# Patient Record
Sex: Female | Born: 1995 | Race: Black or African American | Hispanic: No | Marital: Single | Smoking: Never smoker
Health system: Southern US, Community
[De-identification: ages and names within clinical notes are randomized; demographics above are authoritative.]

## PROBLEM LIST (undated history)

## (undated) DIAGNOSIS — S060XAA Concussion with loss of consciousness status unknown, initial encounter: Secondary | ICD-10-CM

## (undated) DIAGNOSIS — S060X9A Concussion with loss of consciousness of unspecified duration, initial encounter: Secondary | ICD-10-CM

## (undated) DIAGNOSIS — K219 Gastro-esophageal reflux disease without esophagitis: Secondary | ICD-10-CM

## (undated) HISTORY — PX: NO PAST SURGERIES: SHX2092

---

## 2016-02-26 ENCOUNTER — Encounter (HOSPITAL_COMMUNITY): Payer: Self-pay | Admitting: Emergency Medicine

## 2016-02-26 ENCOUNTER — Emergency Department (HOSPITAL_COMMUNITY): Payer: Managed Care, Other (non HMO)

## 2016-02-26 DIAGNOSIS — K219 Gastro-esophageal reflux disease without esophagitis: Secondary | ICD-10-CM | POA: Insufficient documentation

## 2016-02-26 DIAGNOSIS — R0789 Other chest pain: Secondary | ICD-10-CM | POA: Diagnosis not present

## 2016-02-26 DIAGNOSIS — Z79899 Other long term (current) drug therapy: Secondary | ICD-10-CM | POA: Insufficient documentation

## 2016-02-26 DIAGNOSIS — R112 Nausea with vomiting, unspecified: Secondary | ICD-10-CM | POA: Diagnosis present

## 2016-02-26 LAB — BASIC METABOLIC PANEL
Anion gap: 14 (ref 5–15)
BUN: 7 mg/dL (ref 6–20)
CO2: 20 mmol/L — ABNORMAL LOW (ref 22–32)
Calcium: 9.3 mg/dL (ref 8.9–10.3)
Chloride: 101 mmol/L (ref 101–111)
Creatinine, Ser: 0.74 mg/dL (ref 0.44–1.00)
GFR calc Af Amer: >60 mL/min (ref >60)
GLUCOSE: 100 mg/dL — AB (ref 65–99)
POTASSIUM: 3.8 mmol/L (ref 3.5–5.1)
Sodium: 135 mmol/L (ref 135–145)

## 2016-02-26 LAB — I-STAT TROPONIN, ED: Troponin i, poc: 0 ng/mL (ref 0.00–0.08)

## 2016-02-26 LAB — CBC
HEMATOCRIT: 35 % — AB (ref 36.0–46.0)
Hemoglobin: 11.4 g/dL — ABNORMAL LOW (ref 12.0–15.0)
MCH: 24.7 pg — ABNORMAL LOW (ref 26.0–34.0)
MCHC: 32.6 g/dL (ref 30.0–36.0)
MCV: 75.9 fL — AB (ref 78.0–100.0)
Platelets: 237 10*3/uL (ref 150–400)
RBC: 4.61 MIL/uL (ref 3.87–5.11)
RDW: 15.9 % — AB (ref 11.5–15.5)
WBC: 8.8 10*3/uL (ref 4.0–10.5)

## 2016-02-26 LAB — I-STAT BETA HCG BLOOD, ED (MC, WL, AP ONLY): I-stat hCG, quantitative: <5 m[IU]/mL (ref <5)

## 2016-02-26 NOTE — ED Triage Notes (Signed)
Pt states she is having 9/10 left cp that started today and vomited at least 8 time today, denies any fever or chills, no SOB.

## 2016-02-27 ENCOUNTER — Emergency Department (HOSPITAL_COMMUNITY)
Admission: EM | Admit: 2016-02-27 | Discharge: 2016-02-27 | Disposition: A | Discharge status: Home / Self Care | Payer: Managed Care, Other (non HMO) | Attending: Emergency Medicine | Admitting: Emergency Medicine

## 2016-02-27 DIAGNOSIS — R079 Chest pain, unspecified: Secondary | ICD-10-CM

## 2016-02-27 DIAGNOSIS — R112 Nausea with vomiting, unspecified: Secondary | ICD-10-CM

## 2016-02-27 DIAGNOSIS — K219 Gastro-esophageal reflux disease without esophagitis: Secondary | ICD-10-CM

## 2016-02-27 LAB — HEPATIC FUNCTION PANEL
ALT: 12 U/L — ABNORMAL LOW (ref 14–54)
AST: 24 U/L (ref 15–41)
Albumin: 4 g/dL (ref 3.5–5.0)
Alkaline Phosphatase: 52 U/L (ref 38–126)
Bilirubin, Direct: 0.1 mg/dL (ref 0.1–0.5)
Indirect Bilirubin: 0.9 mg/dL (ref 0.3–0.9)
Total Bilirubin: 1 mg/dL (ref 0.3–1.2)
Total Protein: 7.5 g/dL (ref 6.5–8.1)

## 2016-02-27 LAB — LIPASE, BLOOD: Lipase: 18 U/L (ref 11–51)

## 2016-02-27 MED ORDER — GI COCKTAIL ~~LOC~~
30.0000 mL | Freq: Once | ORAL | Status: AC
  Administered 2016-02-27: 30 mL via ORAL
  Filled 2016-02-27: qty 30

## 2016-02-27 MED ORDER — SODIUM CHLORIDE 0.9 % IV BOLUS (SEPSIS)
1000.0000 mL | Freq: Once | INTRAVENOUS | Status: AC
  Administered 2016-02-27: 1000 mL via INTRAVENOUS

## 2016-02-27 MED ORDER — OMEPRAZOLE 20 MG PO CPDR: 20.0000 mg | DELAYED_RELEASE_CAPSULE | Freq: Every day | ORAL | 0 refills | Status: DC

## 2016-02-27 MED ORDER — ONDANSETRON 4 MG PO TBDP: 4.0000 mg | ORAL_TABLET | Freq: Three times a day (TID) | ORAL | 0 refills | Status: DC | PRN

## 2016-02-27 MED ORDER — ONDANSETRON HCL 4 MG/2ML IJ SOLN
4.0000 mg | Freq: Once | INTRAMUSCULAR | Status: AC
  Administered 2016-02-27: 4 mg via INTRAVENOUS
  Filled 2016-02-27: qty 2

## 2016-02-27 NOTE — ED Notes (Signed)
ED Provider at bedside. 

## 2016-02-27 NOTE — ED Provider Notes (Signed)
[facility]-EMERGENCY DEPT Provider Note   CSN: [id number] Arrival date & time: 02/26/16  2153     History   Chief Complaint Chief Complaint  Patient presents with  . Chest Pain  . Emesis    HPI Deborah Krause is a 21 y.o. female.  Deborah Krause is a 21 y.o. Female who presents to the ED complaining of nausea and vomiting starting yesterday and then onset of left-sided upper chest pain. Patient reports she began with nausea and vomiting around 2 PM yesterday. She reports around 4 PM while she was lying in bed she began having left-sided chest pain. She reports associated burping and symptoms of acid reflux. She denies any abdominal pain. She is currently on her menstrual cycle. She denies any hematemesis. No diarrhea. No urinary symptoms. She denies previous abdominal surgeries. She denies any coughing or shortness of breath. No treatments attempted prior to arrival. Patient reports her nausea and vomiting have resolved while in the waiting room but she still has some left-sided chest pain. She reports this is nonradiating and constant. She reports infrequent intermittent alcohol use. She reports last drinking alcohol 4 weeks ago. Patient denies fevers, coughing, shortness of breath, hemoptysis, diarrhea, urinary symptoms, abdominal pain, leg pain, leg swelling, lightheadedness or syncope.   The history is provided by the patient. No language interpreter was used.  Chest Pain   Associated symptoms include cough, nausea and vomiting. Pertinent negatives include no abdominal pain, no back pain, no dizziness, no fever, no headaches, no numbness, no palpitations, no shortness of breath and no weakness.  Emesis   Associated symptoms include cough. Pertinent negatives include no abdominal pain, no chills, no diarrhea, no fever and no headaches.    History reviewed. No pertinent past medical history.  There are no active problems to display for this patient.   History reviewed. No  pertinent surgical history.  OB History    No data available       Home Medications    Prior to Admission medications   Medication Sig Start Date End Date Taking? Authorizing Provider  Acetaminophen-Caff-Pyrilamine (MIDOL COMPLETE PO) Take 2 tablets by mouth daily as needed (pain).   Yes Historical Provider, MD  omeprazole (PRILOSEC) 20 MG capsule Take 1 capsule (20 mg total) by mouth daily. 02/27/16   Everlene Farrier, PA-C  ondansetron (ZOFRAN ODT) 4 MG disintegrating tablet Take 1 tablet (4 mg total) by mouth every 8 (eight) hours as needed for nausea or vomiting. 02/27/16   Everlene Farrier, PA-C    Family History History reviewed. No pertinent family history.  Social History Social History  Substance Use Topics  . Smoking status: Never Smoker  . Smokeless tobacco: Never Used  . Alcohol use No     Allergies   Patient has no known allergies.   Review of Systems Review of Systems  Constitutional: Negative for chills and fever.  HENT: Negative for congestion and sore throat.   Eyes: Negative for visual disturbance.  Respiratory: Positive for cough. Negative for shortness of breath and wheezing.   Cardiovascular: Positive for chest pain. Negative for palpitations and leg swelling.  Gastrointestinal: Positive for nausea and vomiting. Negative for abdominal pain, blood in stool and diarrhea.  Genitourinary: Negative for decreased urine volume, difficulty urinating, dysuria, flank pain, frequency, hematuria and urgency.  Musculoskeletal: Negative for back pain and neck pain.  Skin: Negative for rash.  Neurological: Negative for dizziness, syncope, weakness, light-headedness, numbness and headaches.     Physical Exam Updated Vital  Signs BP 119/67   Pulse 69   Temp 98.7 F (37.1 C) (Oral)   Resp 17   Ht 5\' 3"  (1.6 m)   Wt 71.5 kg   LMP 02/26/2016   SpO2 100%   BMI 27.92 kg/m   Physical Exam  Constitutional: She appears well-developed and well-nourished. No  distress.  Nontoxic appearing.  HENT:  Head: Normocephalic and atraumatic.  Right Ear: External ear normal.  Left Ear: External ear normal.  Mouth/Throat: Oropharynx is clear and moist.  No blood in her oropharynx.  Eyes: Conjunctivae are normal. Pupils are equal, round, and reactive to light. Right eye exhibits no discharge. Left eye exhibits no discharge.  Neck: Normal range of motion. Neck supple. No JVD present. No tracheal deviation present.  Cardiovascular: Normal rate, regular rhythm, normal heart sounds and intact distal pulses.  Exam reveals no gallop and no friction rub.   No murmur heard. Bilateral radial pulses are intact.  Pulmonary/Chest: Effort normal and breath sounds normal. No stridor. No respiratory distress. She has no wheezes. She has no rales. She exhibits tenderness.  Lungs clear to auscultation bilaterally. Left sided chest wall tenderness to palpation which reproduces her chest pain.  Abdominal: Soft. Bowel sounds are normal. She exhibits no distension and no mass. There is tenderness. There is no rebound and no guarding.  Abdomen is soft and bowel sounds are present. Patient has mild epigastric abdominal tenderness to palpation. No peritoneal signs. No lower abdominal tenderness to palpation. No CVA or flank tenderness.  Musculoskeletal: She exhibits no edema or tenderness.  No lower extremity edema or tenderness.  Lymphadenopathy:    She has no cervical adenopathy.  Neurological: She is alert. Coordination normal.  Skin: Skin is warm and dry. Capillary refill takes less than 2 seconds. No rash noted. She is not diaphoretic. No erythema. No pallor.  Psychiatric: She has a normal mood and affect. Her behavior is normal.  Nursing note and vitals reviewed.    ED Treatments / Results  Labs (all labs ordered are listed, but only abnormal results are displayed) Labs Reviewed  BASIC METABOLIC PANEL - Abnormal; Notable for the following:       Result Value   CO2 20  (*)    Glucose, Bld 100 (*)    All other components within normal limits  CBC - Abnormal; Notable for the following:    Hemoglobin 11.4 (*)    HCT 35.0 (*)    MCV 75.9 (*)    MCH 24.7 (*)    RDW 15.9 (*)    All other components within normal limits  HEPATIC FUNCTION PANEL - Abnormal; Notable for the following:    ALT 12 (*)    All other components within normal limits  LIPASE, BLOOD  I-STAT TROPOININ, ED  I-STAT BETA HCG BLOOD, ED ([facility], [facility], [facility] ONLY)    EKG  EKG Interpretation  Date/Time:  Friday February 26 2016 22:06:41 EST Ventricular Rate:  94 PR Interval:  126 QRS Duration: 72 QT Interval:  364 QTC Calculation: 455 R Axis:   78 Text Interpretation:  Normal sinus rhythm Normal ECG Baseline wander No old tracing to compare Confirmed by KNAPP  MD-I, IVA ([id number]) on 02/27/2016 8:42:58 AM       Radiology Dg Chest 2 View  Result Date: 02/26/2016 CLINICAL DATA:  Mid chest pain today.  Smoker.  History of asthma. EXAM: CHEST  2 VIEW COMPARISON:  None. FINDINGS: The heart size and mediastinal contours are within normal limits. Both  lungs are clear. Lungs are clear. No pleural effusion or pneumothorax seen. The visualized skeletal structures are unremarkable. IMPRESSION: Normal chest x-ray. Electronically Signed   By: Bary RichardStan  Maynard M.D.   On: 02/26/2016 22:44    Procedures Procedures (including critical care time)  Medications Ordered in ED Medications  ondansetron (ZOFRAN) injection 4 mg (4 mg Intravenous Given 02/27/16 0713)  sodium chloride 0.9 % bolus 1,000 mL (0 mLs Intravenous Stopped 02/27/16 0841)  gi cocktail (Maalox,Lidocaine,Donnatal) (30 mLs Oral Given 02/27/16 [id number])     Initial Impression / Assessment and Plan / ED Course  I have reviewed the triage vital signs and the nursing notes.  Pertinent labs & imaging results that were available during my care of the patient were reviewed by me and considered in my medical decision making (see chart for details).      This  is a 21 y.o. Female who presents to the ED complaining of nausea and vomiting starting yesterday and then onset of left-sided upper chest pain. Patient reports she began with nausea and vomiting around 2 PM yesterday. She reports around 4 PM while she was lying in bed she began having left-sided chest pain. She reports associated burping and symptoms of acid reflux. She denies any abdominal pain. She is currently on her menstrual cycle. She denies any hematemesis. No diarrhea. No urinary symptoms. She denies previous abdominal surgeries. She denies any coughing or shortness of breath. No treatments attempted prior to arrival. Patient reports her nausea and vomiting have resolved while in the waiting room but she still has some left-sided chest pain. On exam the patient is afebrile nontoxic appearing. Her abdomen is soft and she has mild epigastric tenderness to palpation. No peritoneal signs. Just some mild reproducible chest pain on palpation. Lungs clear to auscultation bilaterally. EKG shows normal sinus rhythm. Workup started in triage shows normal troponin, negative pregnancy test, unremarkable BMP and CBC. Patient is currently on her menstrual cycle. Hepatic function panel and lipase are unremarkable. Patient received fluid bolus, Zofran and a GI cocktail. She reports a GI cocktail resolved her chest pain. Suspect this is from acid reflux due to her vomiting. No concern for ACS or PE at this time.  We'll discharge her with omeprazole and advised she could use over-the-counter Tums if her acid reflux worsens. We'll also discharge with Zofran. She is tolerating by mouth in the emergency department. I advised the patient to follow-up with their primary care provider this week. I advised the patient to return to the emergency department with new or worsening symptoms or new concerns. The patient verbalized understanding and agreement with plan.       Final Clinical Impressions(s) / ED Diagnoses    Final diagnoses:  Non-intractable vomiting with nausea, unspecified vomiting type  Nonspecific chest pain  Gastroesophageal reflux disease, esophagitis presence not specified    New Prescriptions New Prescriptions   OMEPRAZOLE (PRILOSEC) 20 MG CAPSULE    Take 1 capsule (20 mg total) by mouth daily.   ONDANSETRON (ZOFRAN ODT) 4 MG DISINTEGRATING TABLET    Take 1 tablet (4 mg total) by mouth every 8 (eight) hours as needed for nausea or vomiting.     Everlene FarrierWilliam Zakeria Kulzer, PA-C 02/27/16 [id number]    Devoria AlbeIva Knapp, MD 02/27/16 [phone]

## 2016-03-23 ENCOUNTER — Emergency Department (HOSPITAL_COMMUNITY): Payer: No Typology Code available for payment source

## 2016-03-23 ENCOUNTER — Encounter (HOSPITAL_COMMUNITY): Payer: Self-pay

## 2016-03-23 ENCOUNTER — Emergency Department (HOSPITAL_COMMUNITY)
Admission: EM | Admit: 2016-03-23 | Discharge: 2016-03-23 | Disposition: A | Discharge status: Home / Self Care | Payer: No Typology Code available for payment source | Attending: Emergency Medicine | Admitting: Emergency Medicine

## 2016-03-23 DIAGNOSIS — Y9389 Activity, other specified: Secondary | ICD-10-CM | POA: Diagnosis not present

## 2016-03-23 DIAGNOSIS — Y999 Unspecified external cause status: Secondary | ICD-10-CM | POA: Diagnosis not present

## 2016-03-23 DIAGNOSIS — S2002XA Contusion of left breast, initial encounter: Secondary | ICD-10-CM | POA: Diagnosis not present

## 2016-03-23 DIAGNOSIS — Y92410 Unspecified street and highway as the place of occurrence of the external cause: Secondary | ICD-10-CM | POA: Insufficient documentation

## 2016-03-23 MED ORDER — CYCLOBENZAPRINE HCL 10 MG PO TABS: 10.0000 mg | ORAL_TABLET | Freq: Two times a day (BID) | ORAL | 0 refills | Status: DC | PRN

## 2016-03-23 MED ORDER — ACETAMINOPHEN 500 MG PO TABS
1000.0000 mg | ORAL_TABLET | Freq: Once | ORAL | Status: AC
  Administered 2016-03-23: 1000 mg via ORAL
  Filled 2016-03-23: qty 2

## 2016-03-23 MED ORDER — HYDROCODONE-ACETAMINOPHEN 5-325 MG PO TABS: 1.0000 | ORAL_TABLET | Freq: Four times a day (QID) | ORAL | 0 refills | Status: DC | PRN
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
## 2016-03-23 NOTE — ED Notes (Signed)
Patient transported to X-ray 
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
## 2016-03-27 ENCOUNTER — Encounter (HOSPITAL_COMMUNITY): Payer: Self-pay | Admitting: Emergency Medicine

## 2016-03-27 ENCOUNTER — Emergency Department (HOSPITAL_COMMUNITY): Payer: Managed Care, Other (non HMO)

## 2016-03-27 ENCOUNTER — Emergency Department (HOSPITAL_COMMUNITY)
Admission: EM | Admit: 2016-03-27 | Discharge: 2016-03-28 | Disposition: A | Discharge status: Home / Self Care | Payer: Managed Care, Other (non HMO) | Attending: Emergency Medicine | Admitting: Emergency Medicine

## 2016-03-27 DIAGNOSIS — R079 Chest pain, unspecified: Secondary | ICD-10-CM | POA: Diagnosis present

## 2016-03-27 DIAGNOSIS — N644 Mastodynia: Secondary | ICD-10-CM | POA: Diagnosis not present

## 2016-03-27 DIAGNOSIS — Z79899 Other long term (current) drug therapy: Secondary | ICD-10-CM | POA: Insufficient documentation

## 2016-03-27 DIAGNOSIS — K219 Gastro-esophageal reflux disease without esophagitis: Secondary | ICD-10-CM | POA: Diagnosis not present

## 2016-03-27 HISTORY — DX: Gastro-esophageal reflux disease without esophagitis: K21.9

## 2016-03-27 MED ORDER — ONDANSETRON 4 MG PO TBDP
4.0000 mg | ORAL_TABLET | Freq: Once | ORAL | Status: AC
  Administered 2016-03-27: 4 mg via ORAL
  Filled 2016-03-27: qty 1

## 2016-03-27 MED ORDER — KETOROLAC TROMETHAMINE 30 MG/ML IJ SOLN
30.0000 mg | Freq: Once | INTRAMUSCULAR | Status: AC
  Administered 2016-03-27: 30 mg via INTRAMUSCULAR
  Filled 2016-03-27: qty 1

## 2016-03-27 MED ORDER — GI COCKTAIL ~~LOC~~
30.0000 mL | Freq: Once | ORAL | Status: AC
  Administered 2016-03-27: 30 mL via ORAL
  Filled 2016-03-27: qty 30
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
## 2016-03-28 MED ORDER — OMEPRAZOLE 20 MG PO CPDR: 20.0000 mg | DELAYED_RELEASE_CAPSULE | Freq: Every day | ORAL | 0 refills | Status: DC
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
## 2016-05-04 ENCOUNTER — Encounter (HOSPITAL_COMMUNITY): Payer: Self-pay

## 2016-05-04 ENCOUNTER — Inpatient Hospital Stay (HOSPITAL_COMMUNITY)
Admission: AD | Admit: 2016-05-04 | Discharge: 2016-05-04 | Disposition: A | Discharge status: Home / Self Care | Payer: Managed Care, Other (non HMO) | Source: Ambulatory Visit | Attending: Obstetrics & Gynecology | Admitting: Obstetrics & Gynecology

## 2016-05-04 DIAGNOSIS — N939 Abnormal uterine and vaginal bleeding, unspecified: Secondary | ICD-10-CM | POA: Diagnosis not present

## 2016-05-04 HISTORY — DX: Concussion with loss of consciousness status unknown, initial encounter: S06.0XAA

## 2016-05-04 HISTORY — DX: Concussion with loss of consciousness of unspecified duration, initial encounter: S06.0X9A

## 2016-05-04 LAB — CBC
HCT: 32.5 % — ABNORMAL LOW (ref 36.0–46.0)
Hemoglobin: 10.6 g/dL — ABNORMAL LOW (ref 12.0–15.0)
MCH: 24.9 pg — ABNORMAL LOW (ref 26.0–34.0)
MCHC: 32.6 g/dL (ref 30.0–36.0)
MCV: 76.5 fL — ABNORMAL LOW (ref 78.0–100.0)
PLATELETS: 163 10*3/uL (ref 150–400)
RBC: 4.25 MIL/uL (ref 3.87–5.11)
RDW: 16.5 % — AB (ref 11.5–15.5)
WBC: 5.7 10*3/uL (ref 4.0–10.5)

## 2016-05-04 LAB — WET PREP, GENITAL
SPERM: NONE SEEN
TRICH WET PREP: NONE SEEN
YEAST WET PREP: NONE SEEN

## 2016-05-04 LAB — HCG, SERUM, QUALITATIVE: Preg, Serum: NEGATIVE

## 2016-05-04 NOTE — Discharge Instructions (Signed)
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
## 2016-05-04 NOTE — MAU Note (Signed)
Started period but has really not ever stopped.  The begininng was March 19 th, turned into spotting on 23rd and 24th, turned into heavier flow x 1 day then back to spotting since.  Cramping started on 23rd and worse then regular period.  No birth control.  Feels like low abd is swollen.

## 2016-05-05 LAB — GC/CHLAMYDIA PROBE AMP (~~LOC~~) NOT AT ARMC
CHLAMYDIA, DNA PROBE: NEGATIVE
NEISSERIA GONORRHEA: NEGATIVE

## 2016-05-06 ENCOUNTER — Inpatient Hospital Stay (HOSPITAL_COMMUNITY)
Admission: AD | Admit: 2016-05-06 | Discharge: 2016-05-06 | Disposition: A | Discharge status: Home / Self Care | Payer: Managed Care, Other (non HMO) | Source: Ambulatory Visit | Attending: Obstetrics and Gynecology | Admitting: Obstetrics and Gynecology

## 2016-05-06 DIAGNOSIS — K219 Gastro-esophageal reflux disease without esophagitis: Secondary | ICD-10-CM | POA: Diagnosis not present

## 2016-05-06 DIAGNOSIS — R102 Pelvic and perineal pain: Secondary | ICD-10-CM | POA: Insufficient documentation

## 2016-05-06 DIAGNOSIS — N898 Other specified noninflammatory disorders of vagina: Secondary | ICD-10-CM | POA: Diagnosis not present

## 2016-05-06 DIAGNOSIS — R1111 Vomiting without nausea: Secondary | ICD-10-CM | POA: Insufficient documentation

## 2016-05-06 LAB — RPR: RPR: NONREACTIVE

## 2016-05-06 LAB — URINALYSIS, ROUTINE W REFLEX MICROSCOPIC
BILIRUBIN URINE: NEGATIVE
Glucose, UA: NEGATIVE mg/dL
HGB URINE DIPSTICK: NEGATIVE
KETONES UR: 80 mg/dL — AB
Nitrite: NEGATIVE
Protein, ur: NEGATIVE mg/dL
SPECIFIC GRAVITY, URINE: 1.018 (ref 1.005–1.030)
pH: 7 (ref 5.0–8.0)

## 2016-05-06 LAB — HIV ANTIBODY (ROUTINE TESTING W REFLEX): HIV Screen 4th Generation wRfx: NONREACTIVE

## 2016-05-06 MED ORDER — IBUPROFEN 800 MG PO TABS
800.0000 mg | ORAL_TABLET | Freq: Once | ORAL | Status: AC
  Administered 2016-05-06: 800 mg via ORAL
  Filled 2016-05-06: qty 1
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
## 2016-05-06 NOTE — MAU Note (Signed)
Patient presents with same complaint as 2 days ago, mid lower abdominal pain, all cultures were negative, negative HCG, patient was unable to void, we have sent a UA.

## 2016-05-06 NOTE — Discharge Instructions (Signed)
Acute Pain, Adult °Acute pain is a type of pain that may last for just a few days or as long as six months. It is often related to an illness, injury, or medical procedure. Acute pain may be mild, moderate, or severe. It usually goes away once your injury has healed or you are no longer ill. °Pain can make it hard for you to do daily activities. It can cause anxiety and lead to other problems if left untreated. Treatment depends on the cause and severity of your acute pain. °Follow these instructions at home: °· Check your pain level as told by your health care provider. °· Take over-the-counter and prescription medicines only as told by your health care provider. °· If you are taking prescription pain medicine: °¨ Ask your health care provider about taking a stool softener or laxative to prevent constipation. °¨ Do not stop taking the medicine suddenly. Talk to your health care provider about how and when to discontinue prescription pain medicine. °¨ If your pain is severe, do not take more pills than instructed by your health care provider. °¨ Do not take other over-the-counter pain medicines in addition to this medicine unless told by your health care provider. °¨ Do not drive or operate heavy machinery while taking prescription pain medicine. °· Apply ice or heat as told by your health care provider. These may reduce swelling and pain. °· Ask your health care provider if other strategies such as distraction, relaxation, or physical therapies can help your pain. °· Keep all follow-up visits as told by your health care provider. This is important. °Contact a health care provider if: °· You have pain that is not controlled by medicine. °· Your pain does not improve or gets worse. °· You have side effects from pain medicines, such as vomiting or confusion. °Get help right away if: °· You have severe pain. °· You have trouble breathing. °· You lose consciousness. °· You have chest pain or pressure that lasts for more  than a few minutes. Along with the chest pain you may: °¨ Have pain or discomfort in one or both arms, your back, neck, jaw, or stomach. °¨ Have shortness of breath. °¨ Break out in a cold sweat. °¨ Feel nauseous. °¨ Become light-headed. °These symptoms may represent a serious problem that is an emergency. Do not wait to see if the symptoms will go away. Get medical help right away. Call your local emergency services (911 in the [place] Do not drive yourself to the hospital.  °This information is not intended to replace advice given to you by your health care provider. Make sure you discuss any questions you have with your health care provider. °Document Released: 02/08/2015 Document Revised: 07/03/2015 Document Reviewed: 02/08/2015 °Elsevier Interactive Patient Education © 2017 [employer] ° °

## 2016-05-07 LAB — URINE CULTURE

## 2016-05-08 ENCOUNTER — Emergency Department (HOSPITAL_COMMUNITY): Payer: Managed Care, Other (non HMO)

## 2016-05-08 ENCOUNTER — Encounter (HOSPITAL_COMMUNITY): Payer: Self-pay | Admitting: Emergency Medicine

## 2016-05-08 ENCOUNTER — Emergency Department (HOSPITAL_COMMUNITY)
Admission: EM | Admit: 2016-05-08 | Discharge: 2016-05-08 | Disposition: A | Discharge status: Home / Self Care | Payer: Managed Care, Other (non HMO) | Attending: Emergency Medicine | Admitting: Emergency Medicine

## 2016-05-08 DIAGNOSIS — R102 Pelvic and perineal pain: Secondary | ICD-10-CM

## 2016-05-08 DIAGNOSIS — Z79899 Other long term (current) drug therapy: Secondary | ICD-10-CM | POA: Insufficient documentation

## 2016-05-08 DIAGNOSIS — N938 Other specified abnormal uterine and vaginal bleeding: Secondary | ICD-10-CM

## 2016-05-08 DIAGNOSIS — R1030 Lower abdominal pain, unspecified: Secondary | ICD-10-CM | POA: Diagnosis present

## 2016-05-08 LAB — URINALYSIS, ROUTINE W REFLEX MICROSCOPIC
Bacteria, UA: NONE SEEN
Bilirubin Urine: NEGATIVE
GLUCOSE, UA: NEGATIVE mg/dL
Ketones, ur: 5 mg/dL — AB
Leukocytes, UA: NEGATIVE
Nitrite: NEGATIVE
Protein, ur: NEGATIVE mg/dL
SPECIFIC GRAVITY, URINE: 1.001 — AB (ref 1.005–1.030)
pH: 7 (ref 5.0–8.0)

## 2016-05-08 LAB — CBC
HCT: 35.8 % — ABNORMAL LOW (ref 36.0–46.0)
Hemoglobin: 12.1 g/dL (ref 12.0–15.0)
MCH: 25.3 pg — AB (ref 26.0–34.0)
MCHC: 33.8 g/dL (ref 30.0–36.0)
MCV: 74.9 fL — ABNORMAL LOW (ref 78.0–100.0)
PLATELETS: 192 10*3/uL (ref 150–400)
RBC: 4.78 MIL/uL (ref 3.87–5.11)
RDW: 16.1 % — ABNORMAL HIGH (ref 11.5–15.5)
WBC: 5.9 10*3/uL (ref 4.0–10.5)

## 2016-05-08 LAB — COMPREHENSIVE METABOLIC PANEL
ALBUMIN: 4.8 g/dL (ref 3.5–5.0)
ALK PHOS: 60 U/L (ref 38–126)
ALT: 13 U/L — AB (ref 14–54)
AST: 27 U/L (ref 15–41)
Anion gap: 12 (ref 5–15)
BUN: 7 mg/dL (ref 6–20)
CALCIUM: 9.6 mg/dL (ref 8.9–10.3)
CO2: 22 mmol/L (ref 22–32)
CREATININE: 0.7 mg/dL (ref 0.44–1.00)
Chloride: 102 mmol/L (ref 101–111)
GFR calc Af Amer: >60 mL/min (ref >60)
GFR calc non Af Amer: >60 mL/min (ref >60)
GLUCOSE: 79 mg/dL (ref 65–99)
Potassium: 3.2 mmol/L — ABNORMAL LOW (ref 3.5–5.1)
SODIUM: 136 mmol/L (ref 135–145)
Total Bilirubin: 0.9 mg/dL (ref 0.3–1.2)
Total Protein: 8.8 g/dL — ABNORMAL HIGH (ref 6.5–8.1)

## 2016-05-08 LAB — I-STAT BETA HCG BLOOD, ED (MC, WL, AP ONLY)

## 2016-05-08 LAB — LIPASE, BLOOD: Lipase: 14 U/L (ref 11–51)

## 2016-05-08 MED ORDER — IBUPROFEN 800 MG PO TABS
800.0000 mg | ORAL_TABLET | Freq: Once | ORAL | Status: AC
  Administered 2016-05-08: 800 mg via ORAL
  Filled 2016-05-08: qty 1

## 2016-05-08 MED ORDER — IOPAMIDOL (ISOVUE-300) INJECTION 61%
INTRAVENOUS | Status: AC
  Administered 2016-05-08: 100 mL via INTRAVENOUS
  Filled 2016-05-08: qty 100

## 2016-05-08 MED ORDER — IBUPROFEN 800 MG PO TABS: 800.0000 mg | ORAL_TABLET | Freq: Three times a day (TID) | ORAL | 0 refills | Status: DC

## 2016-05-08 NOTE — ED Triage Notes (Signed)
Patient complaining of abdominal in left and right lower side. Patient states that this has been going on for over a week. Patient states she is spotting.

## 2016-05-08 NOTE — ED Provider Notes (Signed)
[facility]-EMERGENCY DEPT Provider Note   CSN: [id number] Arrival date & time: 05/08/16  1845     History   Chief Complaint Chief Complaint  Patient presents with  . Abdominal Pain    HPI Deborah Krause is a 21 y.o. female.  HPI Patient reports she's had abdominal pain for about a week. She was seen at [facility] last week for lower abdominal cramping and spotting. They did an evaluation at that time and she reports they didn't find anything wrong. She reports it seemed a little better but then the spotting and  lower abdominal pain resumed again. Patient does not use birth control. No fever no pain burning or urgency with urination. No vomiting or diarrhea. Past Medical History:  Diagnosis Date  . Concussion   . GERD (gastroesophageal reflux disease)     There are no active problems to display for this patient.   Past Surgical History:  Procedure Laterality Date  . NO PAST SURGERIES      OB History    Gravida Para Term Preterm AB Living   0 0 0 0 0 0   SAB TAB Ectopic Multiple Live Births   0 0 0 0 0       Home Medications    Prior to Admission medications   Medication Sig Start Date End Date Taking? Authorizing Provider  Acetaminophen-Caff-Pyrilamine (MIDOL COMPLETE PO) Take 2 tablets by mouth daily as needed (pain).   Yes Historical Provider, MD  ibuprofen (ADVIL,MOTRIN) 800 MG tablet Take 1 tablet (800 mg total) by mouth 3 (three) times daily. 05/08/16   Arby Barrette, MD    Family History History reviewed. No pertinent family history.  Social History Social History  Substance Use Topics  . Smoking status: Never Smoker  . Smokeless tobacco: Never Used  . Alcohol use No     Allergies   Patient has no known allergies.   Review of Systems Review of Systems 10 Systems reviewed and are negative for acute change except as noted in the HPI.   Physical Exam Updated Vital Signs BP 129/84 (BP Location: Left Arm)   Pulse 77   Temp 97.9 F (36.6  C) (Oral)   Resp 18   Ht  (1.6 m)   Wt 150 lb (68 kg)   LMP 04/25/2016   SpO2 100%   BMI 26.57 kg/m   Physical Exam  Constitutional: She is oriented to person, place, and time. She appears well-developed and well-nourished. No distress.  HENT:  Head: Normocephalic and atraumatic.  Eyes: Conjunctivae are normal.  Neck: Neck supple.  Cardiovascular: Normal rate and regular rhythm.   No murmur heard. Pulmonary/Chest: Effort normal and breath sounds normal. No respiratory distress.  Abdominal: Soft. There is tenderness.  Diffuse lower abdominal pain without guarding. No localizing and no rebound tenderness.  Musculoskeletal: Normal range of motion. She exhibits no edema or tenderness.  Neurological: She is alert and oriented to person, place, and time. No cranial nerve deficit. She exhibits normal muscle tone. Coordination normal.  Skin: Skin is warm and dry.  Psychiatric: She has a normal mood and affect.  Nursing note and vitals reviewed.    ED Treatments / Results  Labs (all labs ordered are listed, but only abnormal results are displayed) Labs Reviewed  COMPREHENSIVE METABOLIC PANEL - Abnormal; Notable for the following:       Result Value   Potassium 3.2 (*)    Total Protein 8.8 (*)    ALT 13 (*)  All other components within normal limits  CBC - Abnormal; Notable for the following:    HCT 35.8 (*)    MCV 74.9 (*)    MCH 25.3 (*)    RDW 16.1 (*)    All other components within normal limits  URINALYSIS, ROUTINE W REFLEX MICROSCOPIC - Abnormal; Notable for the following:    Color, Urine COLORLESS (*)    Specific Gravity, Urine 1.001 (*)    Hgb urine dipstick LARGE (*)    Ketones, ur 5 (*)    Squamous Epithelial / LPF 0-5 (*)    All other components within normal limits  LIPASE, BLOOD  I-STAT BETA HCG BLOOD, ED ([facility], [facility], [facility] ONLY)    EKG  EKG Interpretation None       Radiology US Transvaginal Non-ob  Result Date: 05/08/2016 CLINICAL DATA:  Initial  evaluation for acute pelvic pain with vaginal spotting. EXAM: TRANSABDOMINAL AND TRANSVAGINAL ULTRASOUND OF PELVIS DOPPLER ULTRASOUND OF OVARIES TECHNIQUE: Both transabdominal and transvaginal ultrasound examinations of the pelvis were performed. Transabdominal technique was performed for global imaging of the pelvis including uterus, ovaries, adnexal regions, and pelvic cul-de-sac. It was necessary to proceed with endovaginal exam following the transabdominal exam to visualize the uterus and ovaries. Color and duplex Doppler ultrasound was utilized to evaluate blood flow to the ovaries. COMPARISON:  None. FINDINGS: Uterus Measurements: 7.1 x 3.8 x 4.5 cm. No fibroids or other mass visualized. Endometrium Thickness: 9.8 mm.  No focal abnormality visualized. Right ovary Measurements: 2.7 x 2.0 x 2.4 cm. Normal appearance/no adnexal mass. Left ovary Measurements: 4.3 x 2.3 x 3.2 cm. Normal appearance/no adnexal mass. 2.5 x 2.0 x 2.0 cm cyst likely reflects a normal physiologic cyst. Pulsed Doppler evaluation of both ovaries demonstrates normal low-resistance arterial and venous waveforms. Other findings Trace free fluid within the pelvis, likely physiologic. IMPRESSION: 1. Normal pelvic ultrasound.  No acute abnormality identified. 2. 2.5 cm physiologic left ovarian cyst. 3. Trace physiologic free fluid within the the pelvis. Electronically Signed   By: Rise Mu M.D.   On: 05/08/2016 21:01   US Pelvis Complete  Result Date: 05/08/2016 CLINICAL DATA:  Initial evaluation for acute pelvic pain with vaginal spotting. EXAM: TRANSABDOMINAL AND TRANSVAGINAL ULTRASOUND OF PELVIS DOPPLER ULTRASOUND OF OVARIES TECHNIQUE: Both transabdominal and transvaginal ultrasound examinations of the pelvis were performed. Transabdominal technique was performed for global imaging of the pelvis including uterus, ovaries, adnexal regions, and pelvic cul-de-sac. It was necessary to proceed with endovaginal exam following the  transabdominal exam to visualize the uterus and ovaries. Color and duplex Doppler ultrasound was utilized to evaluate blood flow to the ovaries. COMPARISON:  None. FINDINGS: Uterus Measurements: 7.1 x 3.8 x 4.5 cm. No fibroids or other mass visualized. Endometrium Thickness: 9.8 mm.  No focal abnormality visualized. Right ovary Measurements: 2.7 x 2.0 x 2.4 cm. Normal appearance/no adnexal mass. Left ovary Measurements: 4.3 x 2.3 x 3.2 cm. Normal appearance/no adnexal mass. 2.5 x 2.0 x 2.0 cm cyst likely reflects a normal physiologic cyst. Pulsed Doppler evaluation of both ovaries demonstrates normal low-resistance arterial and venous waveforms. Other findings Trace free fluid within the pelvis, likely physiologic. IMPRESSION: 1. Normal pelvic ultrasound.  No acute abnormality identified. 2. 2.5 cm physiologic left ovarian cyst. 3. Trace physiologic free fluid within the the pelvis. Electronically Signed   By: Rise Mu M.D.   On: 05/08/2016 21:01   Ct Abdomen Pelvis W Contrast  Result Date: 05/08/2016 CLINICAL DATA:  Abdominal pain. EXAM: CT ABDOMEN  AND PELVIS WITH CONTRAST TECHNIQUE: Multidetector CT imaging of the abdomen and pelvis was performed using the standard protocol following bolus administration of intravenous contrast. CONTRAST:  1 ISOVUE-300 IOPAMIDOL (ISOVUE-300) INJECTION 61% COMPARISON:  None FINDINGS: Lower chest: No acute abnormality. Hepatobiliary: No focal liver abnormality is seen. No gallstones, gallbladder wall thickening, or biliary dilatation. Pancreas: Unremarkable. No pancreatic ductal dilatation or surrounding inflammatory changes. Spleen: Normal in size without focal abnormality. Adrenals/Urinary Tract: Adrenal glands are unremarkable. Kidneys are normal, without renal calculi, focal lesion, or hydronephrosis. Bladder is unremarkable. Stomach/Bowel: Stomach is within normal limits. Appendix appears normal. No evidence of bowel wall thickening, distention, or inflammatory  changes. Vascular/Lymphatic: No significant vascular findings are present. No enlarged abdominal or pelvic lymph nodes. Reproductive: Uterus and bilateral adnexa are unremarkable. Other: There is a small volume of free fluid noted within the pelvis. Musculoskeletal: No acute or significant osseous findings. IMPRESSION: 1. No acute findings within the abdomen or the pelvis. 2. Small volume of free fluid identified within the dependent portion of the pelvis which may be physiologic. Electronically Signed   By: Signa Kell M.D.   On: 05/08/2016 22:35   [place] Art/ven Flow Abd Pelv Doppler  Result Date: 05/08/2016 CLINICAL DATA:  Initial evaluation for acute pelvic pain with vaginal spotting. EXAM: TRANSABDOMINAL AND TRANSVAGINAL ULTRASOUND OF PELVIS DOPPLER ULTRASOUND OF OVARIES TECHNIQUE: Both transabdominal and transvaginal ultrasound examinations of the pelvis were performed. Transabdominal technique was performed for global imaging of the pelvis including uterus, ovaries, adnexal regions, and pelvic cul-de-sac. It was necessary to proceed with endovaginal exam following the transabdominal exam to visualize the uterus and ovaries. Color and duplex Doppler ultrasound was utilized to evaluate blood flow to the ovaries. COMPARISON:  None. FINDINGS: Uterus Measurements: 7.1 x 3.8 x 4.5 cm. No fibroids or other mass visualized. Endometrium Thickness: 9.8 mm.  No focal abnormality visualized. Right ovary Measurements: 2.7 x 2.0 x 2.4 cm. Normal appearance/no adnexal mass. Left ovary Measurements: 4.3 x 2.3 x 3.2 cm. Normal appearance/no adnexal mass. 2.5 x 2.0 x 2.0 cm cyst likely reflects a normal physiologic cyst. Pulsed Doppler evaluation of both ovaries demonstrates normal low-resistance arterial and venous waveforms. Other findings Trace free fluid within the pelvis, likely physiologic. IMPRESSION: 1. Normal pelvic ultrasound.  No acute abnormality identified. 2. 2.5 cm physiologic left ovarian cyst. 3. Trace  physiologic free fluid within the the pelvis. Electronically Signed   By: Rise Mu M.D.   On: 05/08/2016 21:01    Procedures Procedures (including critical care time)  Medications Ordered in ED Medications  ibuprofen (ADVIL,MOTRIN) tablet 800 mg (800 mg Oral Given 05/08/16 2018)  iopamidol (ISOVUE-300) 61 % injection (100 mLs Intravenous Contrast Given 05/08/16 2210)     Initial Impression / Assessment and Plan / ED Course  I have reviewed the triage vital signs and the nursing notes.  Pertinent labs & imaging results that were available during my care of the patient were reviewed by me and considered in my medical decision making (see chart for details).      Final Clinical Impressions(s) / ED Diagnoses   Final diagnoses:  Pelvic pain in female  DUB (dysfunctional uterine bleeding)   Patient was seen at [facility] clinic and evaluated for pelvic pain and dysfunctional uterine bleeding. GC and chlamydia returned negative. Patient reports she continues to have pelvic pain and spotting. Ultrasound was obtained without significant anomaly. Patient still felt very concerned about problems of ongoing pelvic pain and wished to proceed with a CT  scan. The patient was counseled of radiation exposure for low probability of positive CT. She did wish to proceed. No acute anomaly identified. Possibly patient expressing ongoing cramping with spotting due to hormonal fluctuations. She is counseled to follow-up with women's outpatient clinic for ongoing assessment if pain persists. New Prescriptions New Prescriptions   IBUPROFEN (ADVIL,MOTRIN) 800 MG TABLET    Take 1 tablet (800 mg total) by mouth 3 (three) times daily.     Arby Barrette, MD 05/08/16 2303

## 2016-05-08 NOTE — ED Notes (Signed)
US at bedside

## 2016-05-23 ENCOUNTER — Encounter (HOSPITAL_COMMUNITY): Payer: Self-pay | Admitting: Emergency Medicine

## 2016-05-23 DIAGNOSIS — Z79899 Other long term (current) drug therapy: Secondary | ICD-10-CM | POA: Diagnosis not present

## 2016-05-23 DIAGNOSIS — N939 Abnormal uterine and vaginal bleeding, unspecified: Secondary | ICD-10-CM | POA: Insufficient documentation

## 2016-05-23 DIAGNOSIS — Z5321 Procedure and treatment not carried out due to patient leaving prior to being seen by health care provider: Secondary | ICD-10-CM | POA: Insufficient documentation

## 2016-05-23 LAB — POC URINE PREG, ED: Preg Test, Ur: NEGATIVE

## 2016-05-23 NOTE — ED Triage Notes (Signed)
Pt reports having vaginal bleeding with clots for the last 2-3 days. Pt states that she is having pain in abd that is similar to pain from when she was dx with ovarian cyst 2 weeks prior.

## 2016-05-24 ENCOUNTER — Emergency Department (HOSPITAL_COMMUNITY)
Admission: EM | Admit: 2016-05-24 | Discharge: 2016-05-24 | Disposition: A | Discharge status: Home / Self Care | Payer: Managed Care, Other (non HMO) | Attending: Emergency Medicine | Admitting: Emergency Medicine

## 2016-05-24 NOTE — ED Notes (Signed)
No answer when called for room assignment x2

## 2016-06-18 ENCOUNTER — Encounter (HOSPITAL_COMMUNITY): Payer: Self-pay | Admitting: Emergency Medicine

## 2016-06-18 ENCOUNTER — Emergency Department (HOSPITAL_COMMUNITY)
Admission: EM | Admit: 2016-06-18 | Discharge: 2016-06-18 | Disposition: A | Discharge status: Home / Self Care | Payer: Managed Care, Other (non HMO) | Attending: Emergency Medicine | Admitting: Emergency Medicine

## 2016-06-18 ENCOUNTER — Emergency Department (HOSPITAL_COMMUNITY): Payer: Managed Care, Other (non HMO)

## 2016-06-18 DIAGNOSIS — N920 Excessive and frequent menstruation with regular cycle: Secondary | ICD-10-CM | POA: Diagnosis not present

## 2016-06-18 DIAGNOSIS — N939 Abnormal uterine and vaginal bleeding, unspecified: Secondary | ICD-10-CM | POA: Diagnosis present

## 2016-06-18 LAB — COMPREHENSIVE METABOLIC PANEL
ALBUMIN: 4.2 g/dL (ref 3.5–5.0)
ALK PHOS: 53 U/L (ref 38–126)
ALT: 15 U/L (ref 14–54)
ANION GAP: 9 (ref 5–15)
AST: 27 U/L (ref 15–41)
BUN: 6 mg/dL (ref 6–20)
CALCIUM: 8.9 mg/dL (ref 8.9–10.3)
CHLORIDE: 110 mmol/L (ref 101–111)
CO2: 18 mmol/L — AB (ref 22–32)
Creatinine, Ser: 0.66 mg/dL (ref 0.44–1.00)
GFR calc non Af Amer: >60 mL/min (ref >60)
GLUCOSE: 102 mg/dL — AB (ref 65–99)
POTASSIUM: 3 mmol/L — AB (ref 3.5–5.1)
SODIUM: 137 mmol/L (ref 135–145)
Total Bilirubin: 0.7 mg/dL (ref 0.3–1.2)
Total Protein: 7.9 g/dL (ref 6.5–8.1)

## 2016-06-18 LAB — WET PREP, GENITAL
Clue Cells Wet Prep HPF POC: NONE SEEN
Sperm: NONE SEEN
Trich, Wet Prep: NONE SEEN
Yeast Wet Prep HPF POC: NONE SEEN

## 2016-06-18 LAB — CBC
HEMATOCRIT: 33 % — AB (ref 36.0–46.0)
HEMOGLOBIN: 10.8 g/dL — AB (ref 12.0–15.0)
MCH: 25.2 pg — AB (ref 26.0–34.0)
MCHC: 32.7 g/dL (ref 30.0–36.0)
MCV: 76.9 fL — AB (ref 78.0–100.0)
Platelets: 201 10*3/uL (ref 150–400)
RBC: 4.29 MIL/uL (ref 3.87–5.11)
RDW: 15 % (ref 11.5–15.5)
WBC: 5.7 10*3/uL (ref 4.0–10.5)

## 2016-06-18 LAB — URINALYSIS, ROUTINE W REFLEX MICROSCOPIC
BACTERIA UA: NONE SEEN
Bilirubin Urine: NEGATIVE
Glucose, UA: NEGATIVE mg/dL
Ketones, ur: 80 mg/dL — AB
Leukocytes, UA: NEGATIVE
NITRITE: NEGATIVE
PROTEIN: NEGATIVE mg/dL
Specific Gravity, Urine: 1.017 (ref 1.005–1.030)
pH: 7 (ref 5.0–8.0)

## 2016-06-18 LAB — LIPASE, BLOOD: Lipase: 13 U/L (ref 11–51)

## 2016-06-18 LAB — POC URINE PREG, ED: PREG TEST UR: NEGATIVE

## 2016-06-18 MED ORDER — KETOROLAC TROMETHAMINE 30 MG/ML IJ SOLN
30.0000 mg | Freq: Once | INTRAMUSCULAR | Status: AC
  Administered 2016-06-18: 30 mg via INTRAMUSCULAR
  Filled 2016-06-18: qty 1

## 2016-06-18 MED ORDER — ONDANSETRON 4 MG PO TBDP
4.0000 mg | ORAL_TABLET | Freq: Once | ORAL | Status: AC
  Administered 2016-06-18: 4 mg via ORAL
  Filled 2016-06-18: qty 1

## 2016-06-18 MED ORDER — GI COCKTAIL ~~LOC~~
30.0000 mL | Freq: Once | ORAL | Status: AC
  Administered 2016-06-18: 30 mL via ORAL
  Filled 2016-06-18: qty 30

## 2016-06-18 MED ORDER — POTASSIUM CHLORIDE CRYS ER 20 MEQ PO TBCR
40.0000 meq | EXTENDED_RELEASE_TABLET | Freq: Once | ORAL | Status: AC
  Administered 2016-06-18: 40 meq via ORAL
  Filled 2016-06-18: qty 2

## 2016-06-18 MED ORDER — ONDANSETRON HCL 4 MG/2ML IJ SOLN
4.0000 mg | Freq: Once | INTRAMUSCULAR | Status: AC
  Administered 2016-06-18: 4 mg via INTRAVENOUS
  Filled 2016-06-18: qty 2

## 2016-06-18 MED ORDER — IBUPROFEN 600 MG PO TABS: 600.0000 mg | ORAL_TABLET | Freq: Four times a day (QID) | ORAL | 0 refills | Status: AC | PRN

## 2016-06-18 MED ORDER — MORPHINE SULFATE (PF) 4 MG/ML IV SOLN
4.0000 mg | Freq: Once | INTRAVENOUS | Status: AC
  Administered 2016-06-18: 4 mg via INTRAVENOUS
  Filled 2016-06-18: qty 1

## 2016-06-18 MED ORDER — OXYCODONE-ACETAMINOPHEN 5-325 MG PO TABS: 1.0000 | ORAL_TABLET | Freq: Once | ORAL | Status: DC

## 2016-06-18 NOTE — Discharge Instructions (Signed)
Please read and follow all provided instructions.  Your diagnoses today include:  1. Menorrhagia with regular cycle     Tests performed today include: Vital signs. See below for your results today.   Medications prescribed:  Take as prescribed   Home care instructions:  Follow any educational materials contained in this packet.  Follow-up instructions: Please follow-up with Gynecology for further evaluation of symptoms and treatment   Return instructions:  Please return to the Emergency Department if you do not get better, if you get worse, or new symptoms OR  - Fever (temperature greater than 101.81F)  - Bleeding that does not stop with holding pressure to the area    -Severe pain (please note that you may be more sore the day after your accident)  - Chest Pain  - Difficulty breathing  - Severe nausea or vomiting  - Inability to tolerate food and liquids  - Passing out  - Skin becoming red around your wounds  - Change in mental status (confusion or lethargy)  - New numbness or weakness    Please return if you have any other emergent concerns.  Additional Information:  Your vital signs today were: BP 125/85 (BP Location: Left Arm)    Pulse 92    Temp 97.8 F (36.6 C) (Oral)    Resp 18    LMP 06/16/2016    SpO2 100%  If your blood pressure (BP) was elevated above 135/85 this visit, please have this repeated by your doctor within one month. ---------------

## 2016-06-18 NOTE — ED Notes (Signed)
Pt states she is in pain, different pain from when she arrived. Pt describes pain as LUQ with vomiting. Pt was screaming and disruptive, disturbing to other patients. Checked on patient several times and patient continues to yell. Will notify PA of patient's pain.

## 2016-06-18 NOTE — ED Notes (Signed)
Delay on POC urine preg, RN Selena BattenKim was unable to obtain urine sample, per RN Selena BattenKim

## 2016-06-18 NOTE — ED Triage Notes (Signed)
Pt verbalizes menstrual cycle starting 2 days ago with complaint of severe LLQ "burning", severe vaginal bleeding "like water", and emesis. Pt verbalizes hx of left ovarian cyst.

## 2016-06-18 NOTE — ED Provider Notes (Signed)
[facility]-EMERGENCY DEPT Provider Note   CSN: [id number] Arrival date & time: 06/18/16  [id number]     History   Chief Complaint Chief Complaint  Patient presents with  . Abdominal Pain  . Vaginal Bleeding  . Emesis    HPI Deborah Krause is a 21 y.o. female.  HPI  21 y.o. female, presents to the Emergency Department today complaining of severe LLQ "burning sensation" as well as vaginal bleeding and emesis x 2 days ago. Notes hx same last month. Pt states that the abdominal pain feels similar to last month as well as previous to that. Per chart review, [facility] as well as in ED for same. Diagnosed with DUB as well as ovarian cyst. Ultrasound in ED on 05-08-16 showed no evidence of Torsion. CT scan negative. Notes emesis this morning as well as yesterday, which is normal for patient during menstrual cycles. NO CP/SOB. No fevers. Pt is on Day 3 of 5 for her menstrual cycle.  Pt does not take any birth control. Has not attempted medication PTA. No other symptoms noted.   Past Medical History:  Diagnosis Date  . Concussion   . GERD (gastroesophageal reflux disease)     There are no active problems to display for this patient.   Past Surgical History:  Procedure Laterality Date  . NO PAST SURGERIES      OB History    Gravida Para Term Preterm AB Living   0 0 0 0 0 0   SAB TAB Ectopic Multiple Live Births   0 0 0 0 0       Home Medications    Prior to Admission medications   Medication Sig Start Date End Date Taking? Authorizing Provider  Acetaminophen-Caff-Pyrilamine (MIDOL COMPLETE PO) Take 2 tablets by mouth daily as needed (pain).    [provider]  ibuprofen (ADVIL,MOTRIN) 800 MG tablet Take 1 tablet (800 mg total) by mouth 3 (three) times daily. 05/08/16   Arby BarrettePfeiffer, Marcy, MD    Family History No family history on file.  Social History Social History  Substance Use Topics  . Smoking status: Never Smoker  . Smokeless tobacco: Never Used  .  Alcohol use No     Allergies   Patient has no known allergies.   Review of Systems Review of Systems ROS reviewed and all are negative for acute change except as noted in the HPI.  Physical Exam Updated Vital Signs BP 125/85 (BP Location: Left Arm)   Pulse 92   Temp 97.8 F (36.6 C) (Oral)   Resp 18   LMP 06/16/2016   SpO2 100%   Physical Exam  Constitutional: She is oriented to person, place, and time. Vital signs are normal. She appears well-developed and well-nourished.  HENT:  Head: Normocephalic and atraumatic.  Right Ear: Hearing normal.  Left Ear: Hearing normal.  Eyes: Conjunctivae and EOM are normal. Pupils are equal, round, and reactive to light.  Neck: Normal range of motion. Neck supple.  Cardiovascular: Normal rate, regular rhythm, normal heart sounds and intact distal pulses.   Pulmonary/Chest: Effort normal and breath sounds normal.  Abdominal: Soft. Normal appearance and bowel sounds are normal. There is no tenderness. There is no rigidity, no rebound, no guarding, no CVA tenderness, no tenderness at McBurney's point and negative Murphy's sign.  Musculoskeletal: Normal range of motion.  Neurological: She is alert and oriented to person, place, and time.  Skin: Skin is warm and dry.  Psychiatric: She has a  normal mood and affect. Her speech is normal and behavior is normal. Thought content normal.  Nursing note and vitals reviewed.  Exam performed by Eston Esters,  exam chaperoned Date: 06/18/2016 Pelvic exam: normal external genitalia without evidence of trauma. VULVA: normal appearing vulva with no masses, tenderness or lesion. VAGINA: normal appearing vagina with normal color and discharge, no lesions. CERVIX: normal appearing cervix without lesions, cervical motion tenderness absent, cervical os closed with out purulent discharge; vaginal discharge - bloody, Wet prep and DNA probe for chlamydia and GC obtained.   ADNEXA: normal adnexa in size, nontender  and no masses UTERUS: uterus is normal size, shape, consistency and nontender.    ED Treatments / Results  Labs (all labs ordered are listed, but only abnormal results are displayed) Labs Reviewed  WET PREP, GENITAL - Abnormal; Notable for the following:       Result Value   WBC, Wet Prep HPF POC RARE (*)    All other components within normal limits  URINALYSIS, ROUTINE W REFLEX MICROSCOPIC - Abnormal; Notable for the following:    APPearance HAZY (*)    Hgb urine dipstick MODERATE (*)    Ketones, ur 80 (*)    Squamous Epithelial / LPF 0-5 (*)    All other components within normal limits  COMPREHENSIVE METABOLIC PANEL - Abnormal; Notable for the following:    Potassium 3.0 (*)    CO2 18 (*)    Glucose, Bld 102 (*)    All other components within normal limits  CBC - Abnormal; Notable for the following:    Hemoglobin 10.8 (*)    HCT 33.0 (*)    MCV 76.9 (*)    MCH 25.2 (*)    All other components within normal limits  LIPASE, BLOOD  POC URINE PREG, ED  GC/CHLAMYDIA PROBE AMP ([place]) NOT AT [facility]    EKG  EKG Interpretation None       Radiology US Transvaginal Non-ob  Result Date: 06/18/2016 CLINICAL DATA:  Left lower quadrant and pelvic pain. EXAM: TRANSABDOMINAL AND TRANSVAGINAL ULTRASOUND OF PELVIS DOPPLER ULTRASOUND OF OVARIES TECHNIQUE: Both transabdominal and transvaginal ultrasound examinations of the pelvis were performed. Transabdominal technique was performed for global imaging of the pelvis including uterus, ovaries, adnexal regions, and pelvic cul-de-sac. It was necessary to proceed with endovaginal exam following the transabdominal exam to visualize the uterus and ovaries. Color and duplex Doppler ultrasound was utilized to evaluate blood flow to the ovaries. COMPARISON:  05/08/2016 FINDINGS: Uterus Measurements: 6.8 x 3.6 x 4.1 cm. No fibroids or other mass visualized. Endometrium Thickness: 6 mm.  No focal abnormality visualized. Right ovary Measurements:  1.8 x 2.7 x 2.2 cm. Normal appearance/no adnexal mass. Left ovary Measurements: 2.8 x 1.8 x 2.3 cm. Normal appearance/no adnexal mass. Pulsed Doppler evaluation of both ovaries demonstrates normal low-resistance arterial and venous waveforms. Other findings No abnormal free fluid. IMPRESSION: Normal pelvic ultrasound.  No evidence of ovarian torsion. Electronically Signed   By: Irish Lack M.D.   On: 06/18/2016 15:14   US Pelvis Complete  Result Date: 06/18/2016 CLINICAL DATA:  Left lower quadrant and pelvic pain. EXAM: TRANSABDOMINAL AND TRANSVAGINAL ULTRASOUND OF PELVIS DOPPLER ULTRASOUND OF OVARIES TECHNIQUE: Both transabdominal and transvaginal ultrasound examinations of the pelvis were performed. Transabdominal technique was performed for global imaging of the pelvis including uterus, ovaries, adnexal regions, and pelvic cul-de-sac. It was necessary to proceed with endovaginal exam following the transabdominal exam to visualize the uterus and ovaries. Color and  duplex Doppler ultrasound was utilized to evaluate blood flow to the ovaries. COMPARISON:  05/08/2016 FINDINGS: Uterus Measurements: 6.8 x 3.6 x 4.1 cm. No fibroids or other mass visualized. Endometrium Thickness: 6 mm.  No focal abnormality visualized. Right ovary Measurements: 1.8 x 2.7 x 2.2 cm. Normal appearance/no adnexal mass. Left ovary Measurements: 2.8 x 1.8 x 2.3 cm. Normal appearance/no adnexal mass. Pulsed Doppler evaluation of both ovaries demonstrates normal low-resistance arterial and venous waveforms. Other findings No abnormal free fluid. IMPRESSION: Normal pelvic ultrasound.  No evidence of ovarian torsion. Electronically Signed   By: Irish Lack M.D.   On: 06/18/2016 15:14   [place] Art/ven Flow Abd Pelv Doppler  Result Date: 06/18/2016 CLINICAL DATA:  Left lower quadrant and pelvic pain. EXAM: TRANSABDOMINAL AND TRANSVAGINAL ULTRASOUND OF PELVIS DOPPLER ULTRASOUND OF OVARIES TECHNIQUE: Both transabdominal and transvaginal  ultrasound examinations of the pelvis were performed. Transabdominal technique was performed for global imaging of the pelvis including uterus, ovaries, adnexal regions, and pelvic cul-de-sac. It was necessary to proceed with endovaginal exam following the transabdominal exam to visualize the uterus and ovaries. Color and duplex Doppler ultrasound was utilized to evaluate blood flow to the ovaries. COMPARISON:  05/08/2016 FINDINGS: Uterus Measurements: 6.8 x 3.6 x 4.1 cm. No fibroids or other mass visualized. Endometrium Thickness: 6 mm.  No focal abnormality visualized. Right ovary Measurements: 1.8 x 2.7 x 2.2 cm. Normal appearance/no adnexal mass. Left ovary Measurements: 2.8 x 1.8 x 2.3 cm. Normal appearance/no adnexal mass. Pulsed Doppler evaluation of both ovaries demonstrates normal low-resistance arterial and venous waveforms. Other findings No abnormal free fluid. IMPRESSION: Normal pelvic ultrasound.  No evidence of ovarian torsion. Electronically Signed   By: Irish Lack M.D.   On: 06/18/2016 15:14    Procedures Procedures (including critical care time)  Medications Ordered in ED Medications  ketorolac (TORADOL) 30 MG/ML injection 30 mg (30 mg Intramuscular Given 06/18/16 1049)  ondansetron (ZOFRAN-ODT) disintegrating tablet 4 mg (4 mg Oral Given 06/18/16 1048)  gi cocktail (Maalox,Lidocaine,Donnatal) (30 mLs Oral Given 06/18/16 1129)  morphine 4 MG/ML injection 4 mg (4 mg Intravenous Given 06/18/16 1245)  ondansetron (ZOFRAN) injection 4 mg (4 mg Intravenous Given 06/18/16 1245)  potassium chloride SA (K-DUR,KLOR-CON) CR tablet 40 mEq (40 mEq Oral Given 06/18/16 1426)     Initial Impression / Assessment and Plan / ED Course  I have reviewed the triage vital signs and the nursing notes.  Pertinent labs & imaging results that were available during my care of the patient were reviewed by me and considered in my medical decision making (see chart for details).  Final Clinical  Impressions(s) / ED Diagnoses  {I have reviewed and evaluated the relevant laboratory values. {I have reviewed and evaluated the relevant imaging studies.  {I have reviewed the relevant previous healthcare records.  {I obtained HPI from historian.   ED Course:  Assessment: Pt is a 21 y.o. female who presents with LLQ "burning sensation" as well as vaginal bleeding and emesis x 2 days ago. Notes hx same last month. Pt states that the abdominal pain feels similar to last month as well as previous to that. Per chart review, pt seen at [facility] as well as in ED for same. Diagnosed with DUB as well as ovarian cyst. Ultrasound in ED on 05-08-16 showed no evidence of Torsion. CT scan negative. Notes emesis this morning as well as yesterday, which is normal for patient during menstrual cycles. . On exam, pt in NAD. Nontoxic/nonseptic appearing. VSS.  Afebrile. Lungs CTA. Heart RRR. Abdomen nontender soft. GU Exam unremarkable. Bleeding noted, but expected as patient on Day 3 of 5 for menstrual cycle. No Adnexal. No CMT. Wet prep unremarkable. GC obtained. UA negative. Given Toradol in ED. Likely menorrhagia from suspect ovarian cyst. Plan is to DC home with follow up to OBGYN. At time of discharge, Patient is in no acute distress. Vital Signs are stable. Patient is able to ambulate. Patient able to tolerate PO.   11:07 AM- Pt began screaming in ED due to pain. When I entered room, endorsed pain in LUQ/Epigastric region. Pt believed to be due to emesis. Denies hematochezia. Does not endorse worsening pain in lower quadrants. On exam the abdomen was soft, but TTP LUQ/Epigastric region. CBC/CMP/Lipase ordered. GI cocktail given as well as analgesia. Due to persistent symptoms of nausea and emesis, will order Transvaginal US as well. Pt states it occurred similarly last year with emesis in this frequency, but states that it has been a while. No hx Torsion.   3:18 PM- Ultrasound unremarkable. Pain controlled. Will  DC patient home with follow up to GYN. At time of discharge, Patient is in no acute distress. Vital Signs are stable. Patient is able to ambulate. Patient able to tolerate PO.   Disposition/Plan:  DC Home Additional Verbal discharge instructions given and discussed with patient.  Pt Instructed to f/u with GYN in the next week for evaluation and treatment of symptoms. Return precautions given Pt acknowledges and agrees with plan  Supervising Physician Jerelyn Scott, MD  Final diagnoses:  Menorrhagia with regular cycle    New Prescriptions New Prescriptions   No medications on file     Audry Pili, Cordelia Poche 06/18/16 1518    Jerelyn Scott, MD 06/18/16 1523

## 2016-06-18 NOTE — ED Notes (Signed)
Pt vomiting, PA aware. Pain meds held.

## 2016-06-18 NOTE — ED Notes (Signed)
URINE REQUEST HAS BEEN MADE 

## 2016-06-18 NOTE — ED Notes (Signed)
Unsuccessful IV attempt x2. Will have another RN attempt [place] IV.

## 2016-06-20 LAB — GC/CHLAMYDIA PROBE AMP (~~LOC~~) NOT AT ARMC
CHLAMYDIA, DNA PROBE: NEGATIVE
Neisseria Gonorrhea: NEGATIVE

## 2018-10-01 IMAGING — US US PELVIS COMPLETE
1 series · 13 of 25 positions shown · non-contrast
Comparison: None.

CLINICAL DATA: Initial evaluation for acute pelvic pain with
vaginal spotting.

EXAM:
TRANSABDOMINAL AND TRANSVAGINAL ULTRASOUND OF PELVIS
DOPPLER ULTRASOUND OF OVARIES
TECHNIQUE: Both transabdominal and transvaginal ultrasound examinations of the
pelvis were performed. Transabdominal technique was performed for
global imaging of the pelvis including uterus, ovaries, adnexal
regions, and pelvic cul-de-sac.
It was necessary to proceed with endovaginal exam following the
transabdominal exam to visualize the uterus and ovaries. Color and
duplex Doppler ultrasound was utilized to evaluate blood flow to the
ovaries.

[Series 1: us pelvis complete · 0.20mm/px · 13 of 87 slices shown]
[im 1/87]
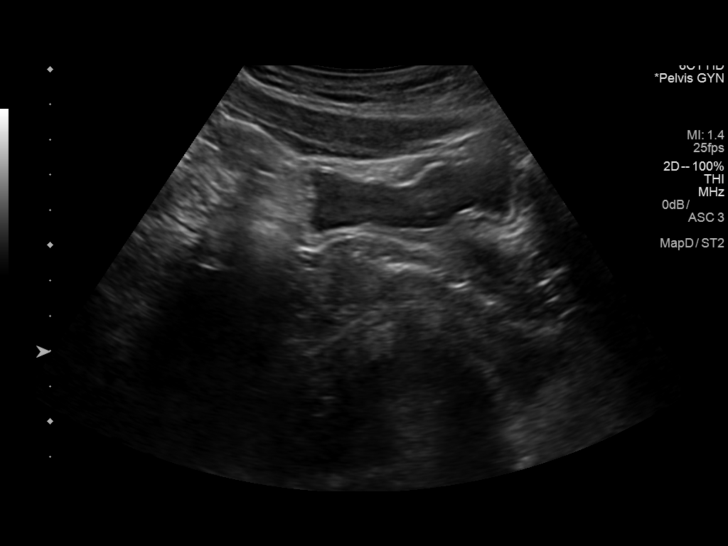
[im 8/87]
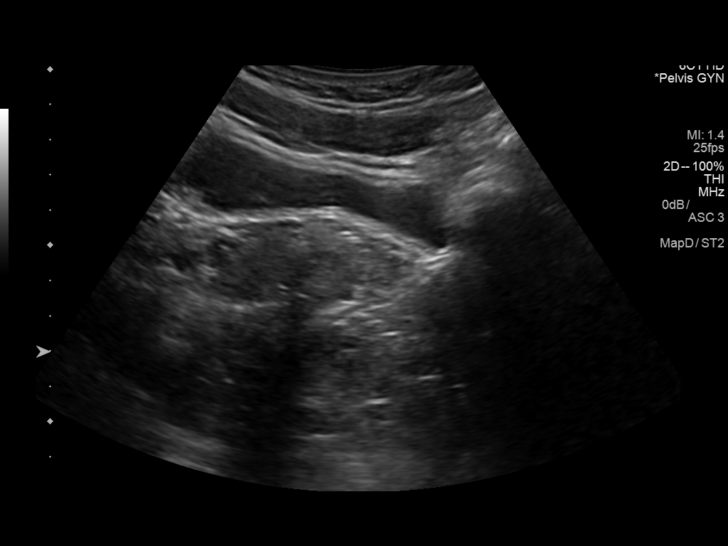
[im 15/87]
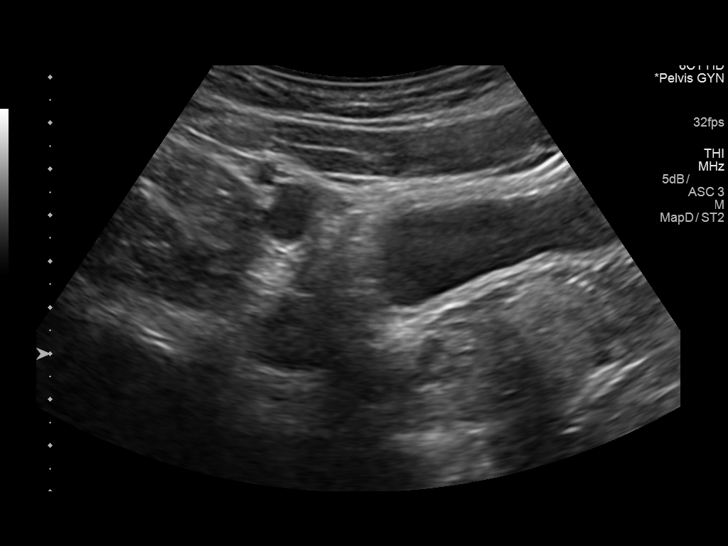
[im 22/87]
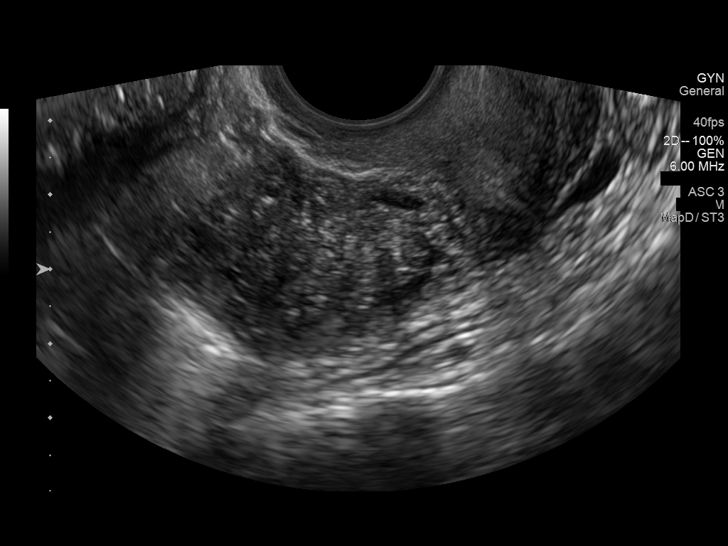
[im 29/87]
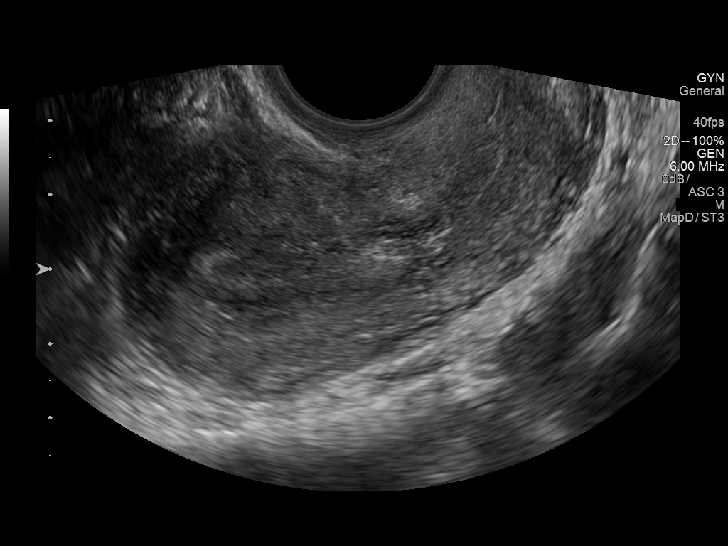
[im 36/87]
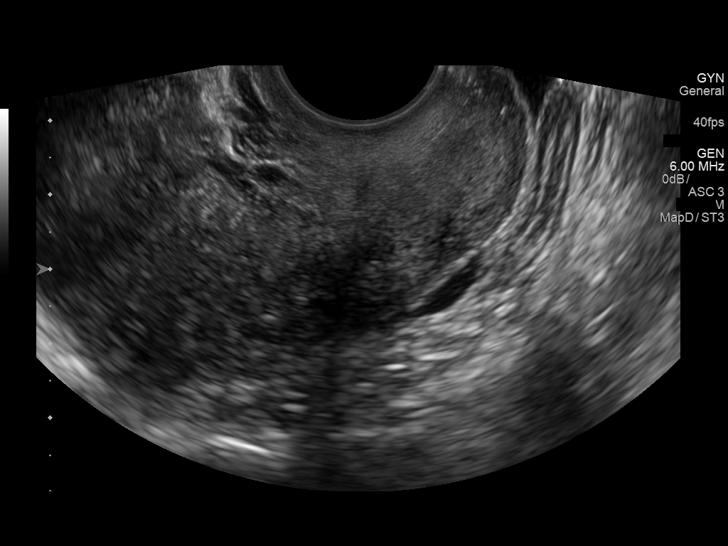
[im 44/87]
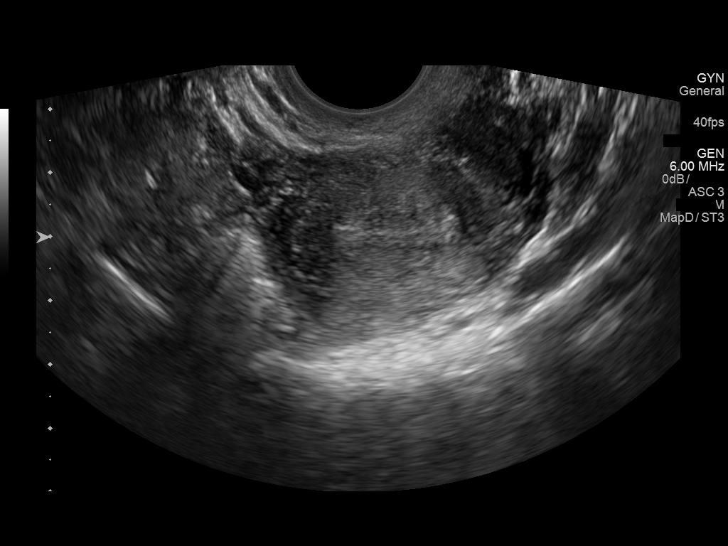
[im 51/87]
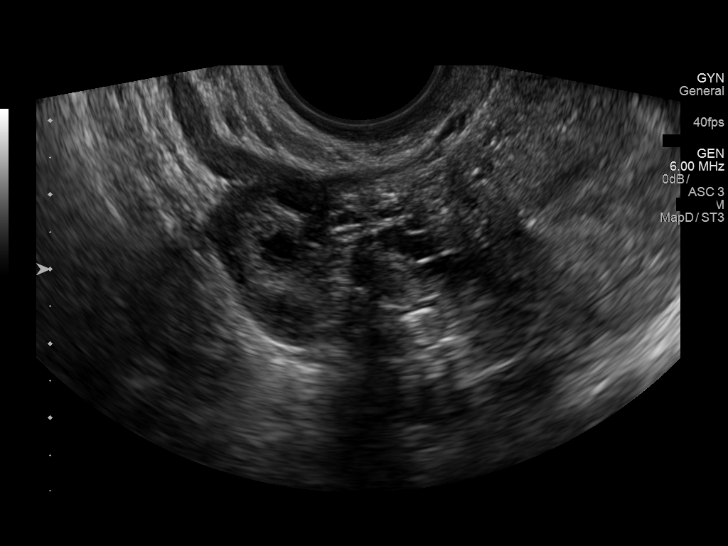
[im 58/87]
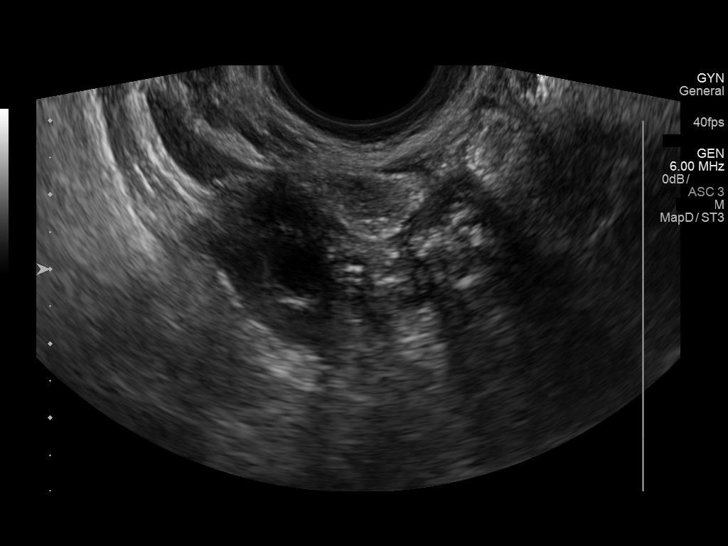
[im 65/87]
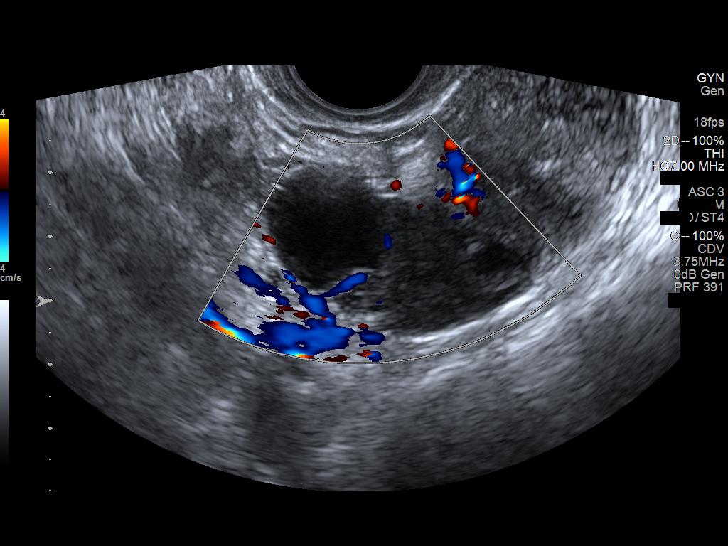
[im 72/87]
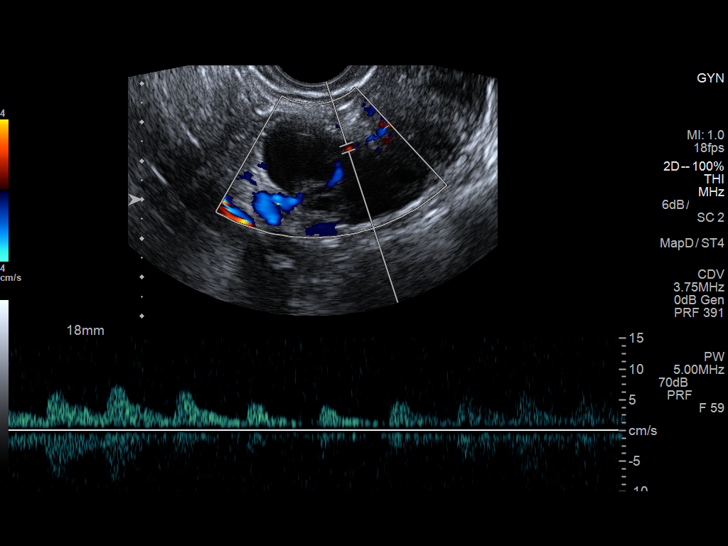
[im 79/87]
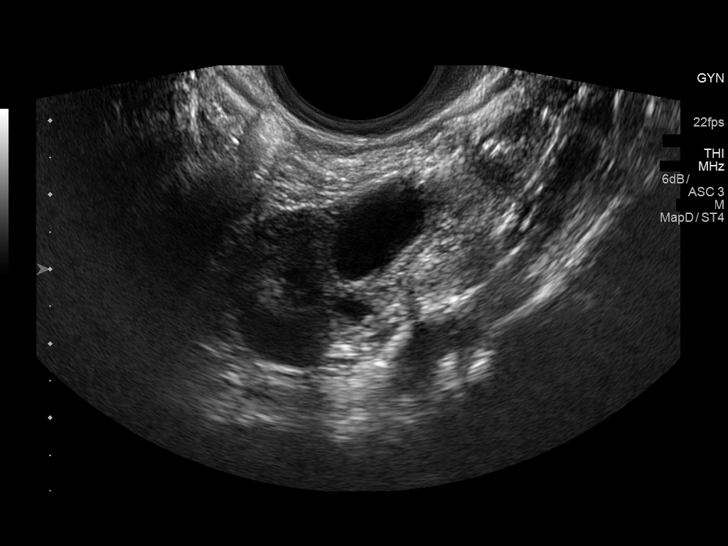
[im 87/87]
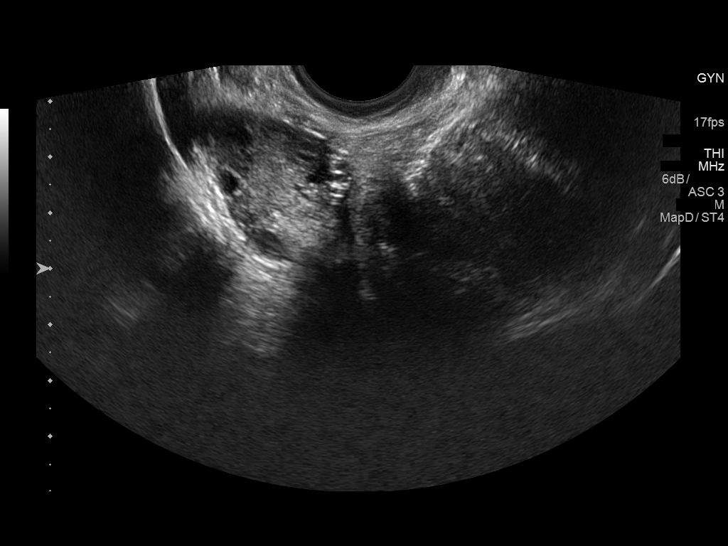

[13 of 25 positions shown; findings below may reference images not displayed]

FINDINGS: Uterus

Measurements: 7.1 x 3.8 x 4.5 cm. No fibroids or other mass
visualized.

Endometrium

Thickness: 9.8 mm.  No focal abnormality visualized.

Right ovary

Measurements: 2.7 x 2.0 x 2.4 cm. Normal appearance/no adnexal mass.

Left ovary

Measurements: 4.3 x 2.3 x 3.2 cm.. Normal appearance/no adnexal
mass. 2.5 x 2.0 x 2.0 cm cyst likely reflects a normal physiologic
cyst.

Pulsed Doppler evaluation of both ovaries demonstrates normal
low-resistance arterial and venous waveforms.

Other findings

Trace free fluid within the pelvis, likely physiologic.
IMPRESSION: 1. Normal pelvic ultrasound.  No acute abnormality identified.
2. 2.5 cm physiologic left ovarian cyst.
3. Trace physiologic free fluid within the the pelvis.

## 2018-11-11 IMAGING — US US TRANSVAGINAL NON-OB
2 series · 13 of 25 positions shown · non-contrast
Comparison: 05/08/2016

CLINICAL DATA: Left lower quadrant and pelvic pain.

EXAM:
TRANSABDOMINAL AND TRANSVAGINAL ULTRASOUND OF PELVIS
DOPPLER ULTRASOUND OF OVARIES
TECHNIQUE: Both transabdominal and transvaginal ultrasound examinations of the
pelvis were performed. Transabdominal technique was performed for
global imaging of the pelvis including uterus, ovaries, adnexal
regions, and pelvic cul-de-sac.
It was necessary to proceed with endovaginal exam following the
transabdominal exam to visualize the uterus and ovaries. Color and
duplex Doppler ultrasound was utilized to evaluate blood flow to the
ovaries.

[Series 1: us transvaginal non-ob · 0.24mm/px · 12 of 57 slices shown (1 of 2)]
[im 1/57]
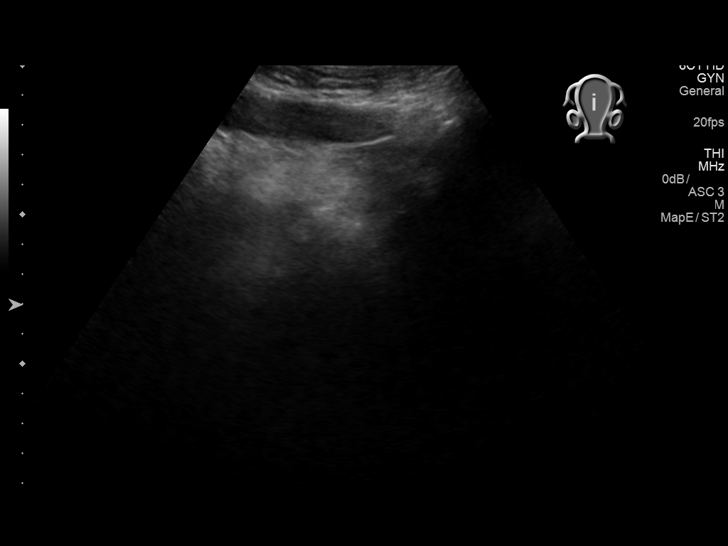
[im 5/57]
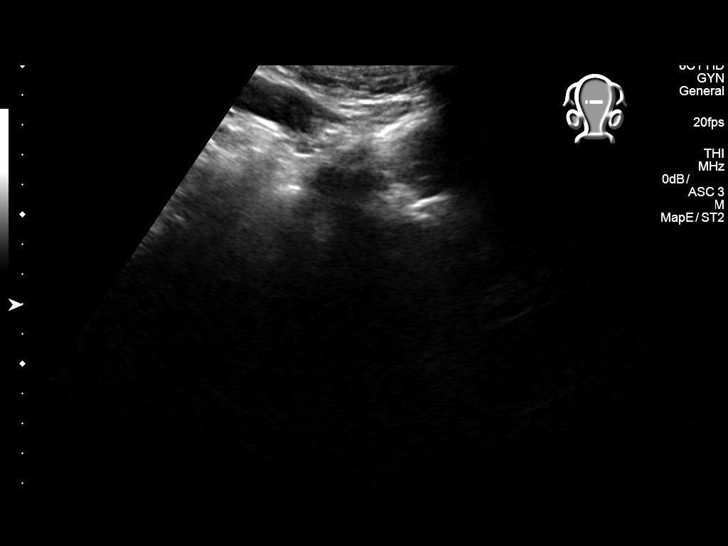
[im 10/57]
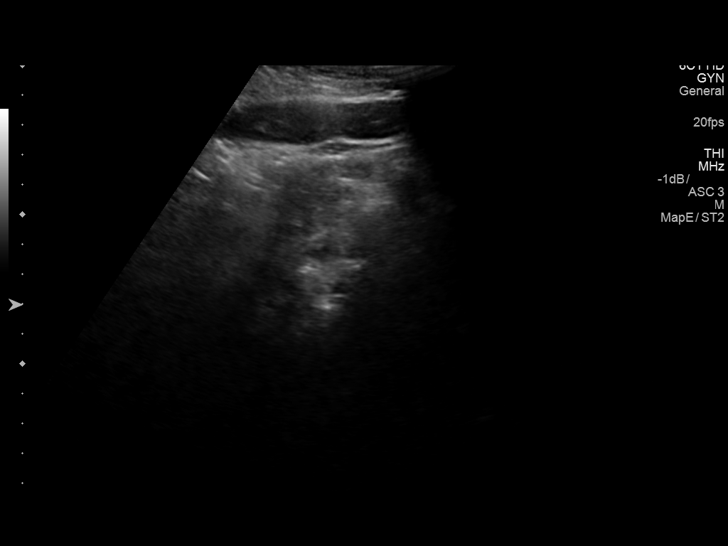
[im 15/57]
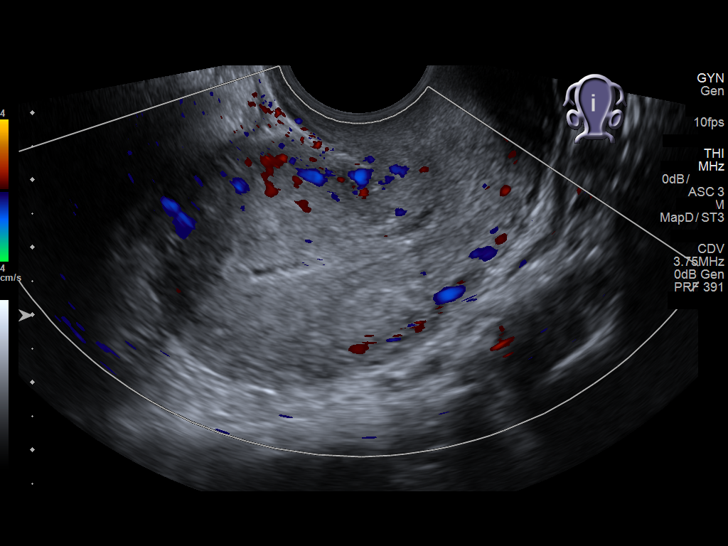
[im 20/57]
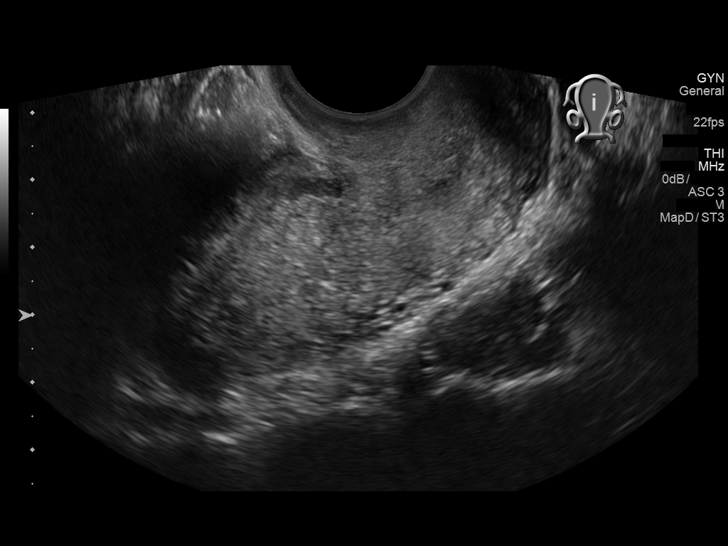
[im 25/57]
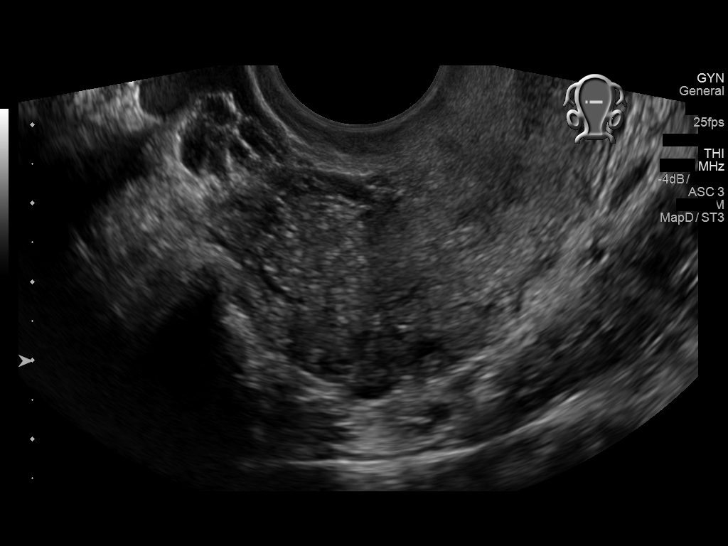
[im 30/57]
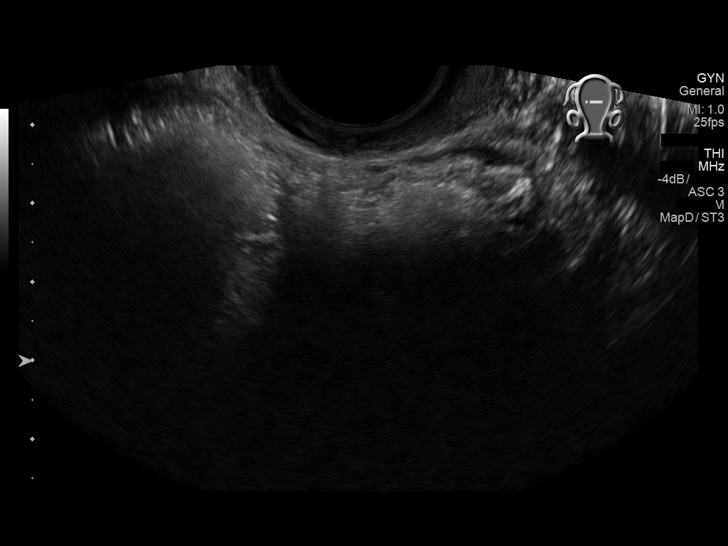
[im 35/57]
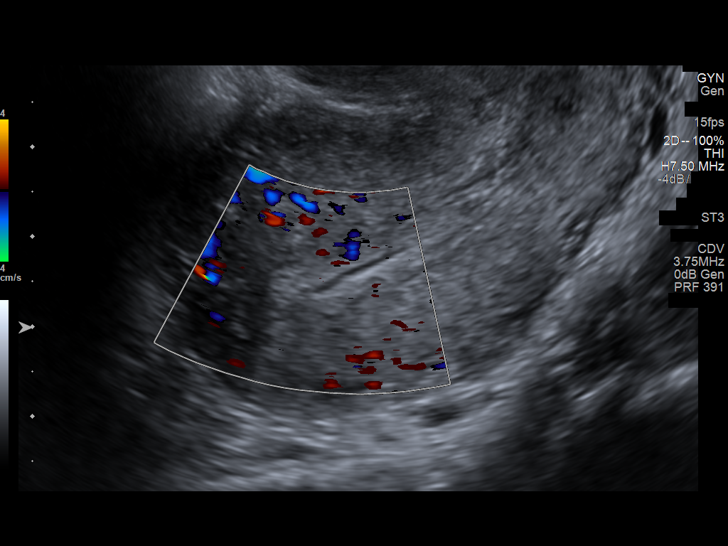
[im 39/57]
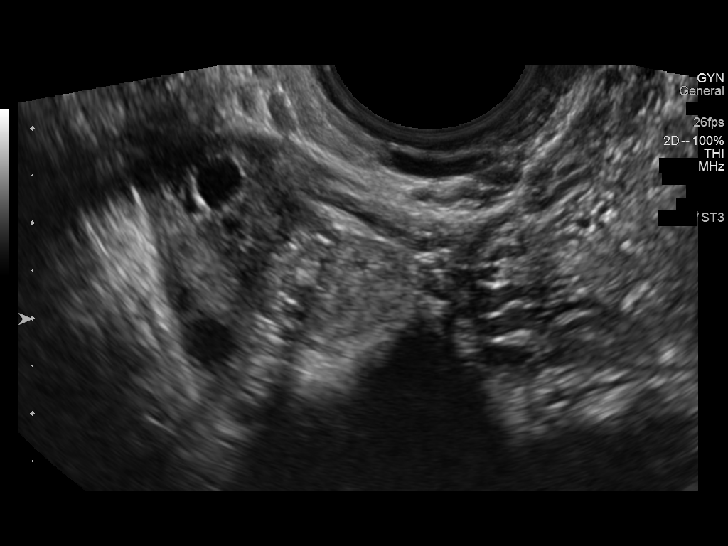
[im 44/57]
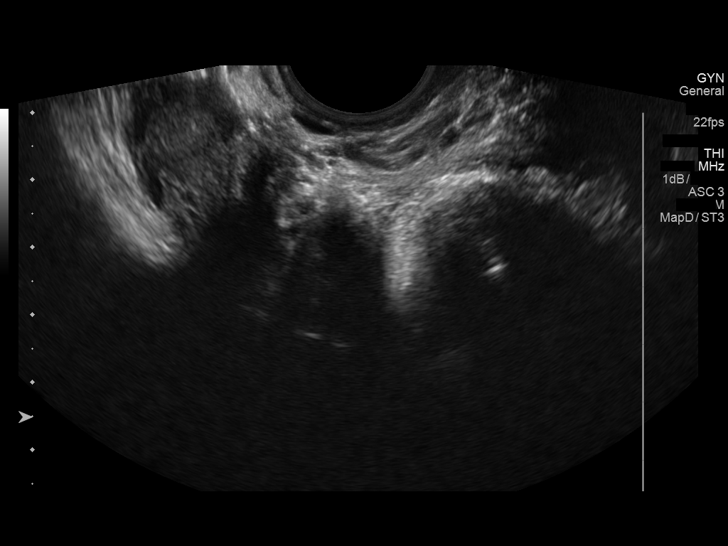
[im 49/57]
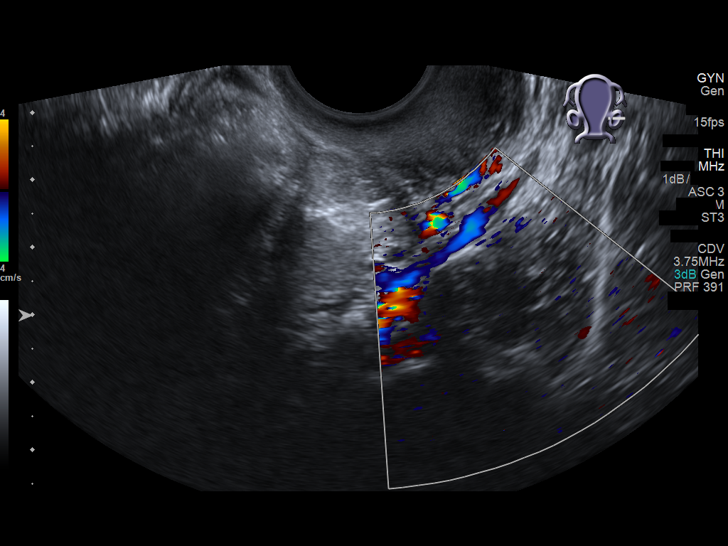
[im 54/57]
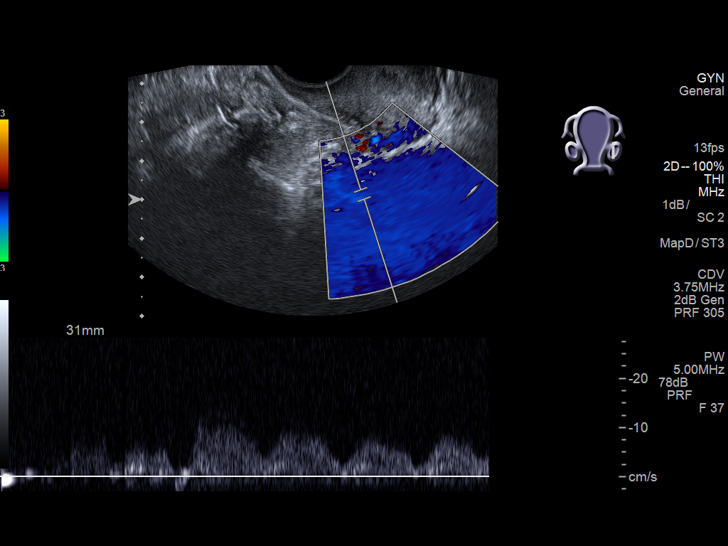

[Series 3: us transvaginal non-ob · 0.07mm/px · 1 of 2 slices shown (2 of 2)]
[im 1/2]
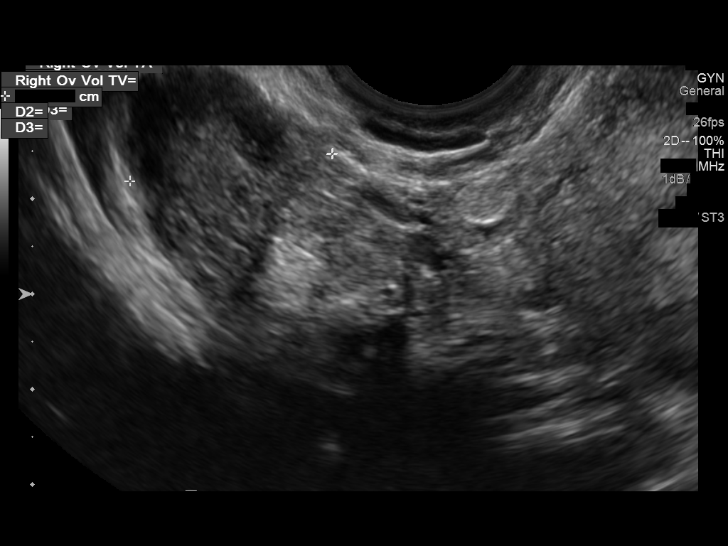

[13 of 25 positions shown; findings below may reference images not displayed]

FINDINGS: Uterus

Measurements: 6.8 x 3.6 x 4.1 cm. No fibroids or other mass
visualized.

Endometrium

Thickness: 6 mm.  No focal abnormality visualized.

Right ovary

Measurements: 1.8 x 2.7 x 2.2 cm. Normal appearance/no adnexal mass.

Left ovary

Measurements: 2.8 x 1.8 x 2.3 cm. Normal appearance/no adnexal mass.

Pulsed Doppler evaluation of both ovaries demonstrates normal
low-resistance arterial and venous waveforms.

Other findings

No abnormal free fluid.
IMPRESSION: Normal pelvic ultrasound.  No evidence of ovarian torsion.
